# Patient Record
Sex: Male | Born: 2000 | Race: Black or African American | Hispanic: No | Marital: Single
Health system: Southern US, Community
[De-identification: ages and names within clinical notes are randomized; demographics above are authoritative.]

## PROBLEM LIST (undated history)

## (undated) ENCOUNTER — Emergency Department (HOSPITAL_COMMUNITY): Payer: No Typology Code available for payment source | Source: Home / Self Care

## (undated) DIAGNOSIS — F329 Major depressive disorder, single episode, unspecified: Secondary | ICD-10-CM

## (undated) DIAGNOSIS — F32A Depression, unspecified: Secondary | ICD-10-CM

---
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
## 2020-02-14 ENCOUNTER — Emergency Department (HOSPITAL_COMMUNITY): Payer: No Typology Code available for payment source

## 2020-02-14 ENCOUNTER — Other Ambulatory Visit: Payer: Self-pay

## 2020-02-14 ENCOUNTER — Emergency Department (HOSPITAL_COMMUNITY)
Admission: EM | Admit: 2020-02-14 | Discharge: 2020-02-14 | Disposition: A | Discharge status: Home / Self Care | Payer: No Typology Code available for payment source | Attending: Emergency Medicine | Admitting: Emergency Medicine

## 2020-02-14 ENCOUNTER — Encounter (HOSPITAL_COMMUNITY): Payer: Self-pay | Admitting: Emergency Medicine

## 2020-02-14 DIAGNOSIS — S27322A Contusion of lung, bilateral, initial encounter: Secondary | ICD-10-CM | POA: Diagnosis not present

## 2020-02-14 DIAGNOSIS — M542 Cervicalgia: Secondary | ICD-10-CM | POA: Insufficient documentation

## 2020-02-14 DIAGNOSIS — S22030A Wedge compression fracture of third thoracic vertebra, initial encounter for closed fracture: Secondary | ICD-10-CM | POA: Diagnosis not present

## 2020-02-14 DIAGNOSIS — R410 Disorientation, unspecified: Secondary | ICD-10-CM | POA: Insufficient documentation

## 2020-02-14 DIAGNOSIS — R202 Paresthesia of skin: Secondary | ICD-10-CM | POA: Diagnosis not present

## 2020-02-14 DIAGNOSIS — Y929 Unspecified place or not applicable: Secondary | ICD-10-CM | POA: Diagnosis not present

## 2020-02-14 DIAGNOSIS — T07XXXA Unspecified multiple injuries, initial encounter: Secondary | ICD-10-CM | POA: Diagnosis present

## 2020-02-14 DIAGNOSIS — R200 Anesthesia of skin: Secondary | ICD-10-CM | POA: Diagnosis not present

## 2020-02-14 DIAGNOSIS — M546 Pain in thoracic spine: Secondary | ICD-10-CM | POA: Insufficient documentation

## 2020-02-14 DIAGNOSIS — Y939 Activity, unspecified: Secondary | ICD-10-CM | POA: Insufficient documentation

## 2020-02-14 DIAGNOSIS — Y999 Unspecified external cause status: Secondary | ICD-10-CM | POA: Insufficient documentation

## 2020-02-14 DIAGNOSIS — Z20822 Contact with and (suspected) exposure to covid-19: Secondary | ICD-10-CM | POA: Insufficient documentation

## 2020-02-14 LAB — CBC
HCT: 42.7 % (ref 39.0–52.0)
Hemoglobin: 13.6 g/dL (ref 13.0–17.0)
MCH: 32.5 pg (ref 26.0–34.0)
MCHC: 31.9 g/dL (ref 30.0–36.0)
MCV: 102.2 fL — ABNORMAL HIGH (ref 80.0–100.0)
Platelets: 162 10*3/uL (ref 150–400)
RBC: 4.18 MIL/uL — ABNORMAL LOW (ref 4.22–5.81)
RDW: 13 % (ref 11.5–15.5)
WBC: 8.3 10*3/uL (ref 4.0–10.5)
nRBC: 0 % (ref 0.0–0.2)

## 2020-02-14 LAB — PROTIME-INR
INR: 1.1 (ref 0.8–1.2)
Prothrombin Time: 13.9 seconds (ref 11.4–15.2)

## 2020-02-14 LAB — I-STAT CHEM 8, ED
BUN: 14 mg/dL (ref 6–20)
Calcium, Ion: 0.66 mmol/L — CL (ref 1.15–1.40)
Chloride: 113 mmol/L — ABNORMAL HIGH (ref 98–111)
Creatinine, Ser: 0.9 mg/dL (ref 0.61–1.24)
Glucose, Bld: 93 mg/dL (ref 70–99)
HCT: 40 % (ref 39.0–52.0)
Hemoglobin: 13.6 g/dL (ref 13.0–17.0)
Potassium: 3.4 mmol/L — ABNORMAL LOW (ref 3.5–5.1)
Sodium: 134 mmol/L — ABNORMAL LOW (ref 135–145)
TCO2: 21 mmol/L — ABNORMAL LOW (ref 22–32)

## 2020-02-14 LAB — COMPREHENSIVE METABOLIC PANEL
ALT: 30 U/L (ref 0–44)
AST: 64 U/L — ABNORMAL HIGH (ref 15–41)
Albumin: 3.9 g/dL (ref 3.5–5.0)
Alkaline Phosphatase: 70 U/L (ref 38–126)
Anion gap: 11 (ref 5–15)
BUN: 13 mg/dL (ref 6–20)
CO2: 22 mmol/L (ref 22–32)
Calcium: 9.1 mg/dL (ref 8.9–10.3)
Chloride: 107 mmol/L (ref 98–111)
Creatinine, Ser: 0.86 mg/dL (ref 0.61–1.24)
GFR calc Af Amer: >60 mL/min (ref >60)
GFR calc non Af Amer: >60 mL/min (ref >60)
Glucose, Bld: 95 mg/dL (ref 70–99)
Potassium: 3.8 mmol/L (ref 3.5–5.1)
Sodium: 140 mmol/L (ref 135–145)
Total Bilirubin: 0.5 mg/dL (ref 0.3–1.2)
Total Protein: 7.1 g/dL (ref 6.5–8.1)

## 2020-02-14 LAB — RESPIRATORY PANEL BY RT PCR (FLU A&B, COVID)
Influenza A by PCR: NEGATIVE
Influenza B by PCR: NEGATIVE
SARS Coronavirus 2 by RT PCR: NEGATIVE

## 2020-02-14 LAB — SAMPLE TO BLOOD BANK

## 2020-02-14 LAB — LACTIC ACID, PLASMA: Lactic Acid, Venous: 2.6 mmol/L (ref 0.5–1.9)

## 2020-02-14 LAB — ETHANOL: Alcohol, Ethyl (B): 39 mg/dL — ABNORMAL HIGH (ref <10)

## 2020-02-14 MED ORDER — CYCLOBENZAPRINE HCL 10 MG PO TABS
10.0000 mg | ORAL_TABLET | Freq: Two times a day (BID) | ORAL | 0 refills | Status: AC | PRN
Start: 2020-02-14 — End: ?

## 2020-02-14 MED ORDER — MORPHINE SULFATE (PF) 4 MG/ML IV SOLN: 4.0000 mg | Freq: Once | INTRAVENOUS | Status: DC

## 2020-02-14 MED ORDER — HYDROCODONE-ACETAMINOPHEN 5-325 MG PO TABS: 1.0000 | ORAL_TABLET | ORAL | 0 refills | Status: AC | PRN

## 2020-02-14 MED ORDER — LORAZEPAM 2 MG/ML IJ SOLN
0.5000 mg | Freq: Once | INTRAMUSCULAR | Status: AC | PRN
  Administered 2020-02-14: 0.5 mg via INTRAVENOUS
  Filled 2020-02-14: qty 1

## 2020-02-14 MED ORDER — ONDANSETRON HCL 4 MG/2ML IJ SOLN
4.0000 mg | Freq: Once | INTRAMUSCULAR | Status: AC
  Administered 2020-02-14: 4 mg via INTRAVENOUS

## 2020-02-14 MED ORDER — LORAZEPAM 2 MG/ML IJ SOLN: 0.5000 mg | Freq: Once | INTRAMUSCULAR | Status: DC

## 2020-02-14 MED ORDER — MORPHINE SULFATE (PF) 4 MG/ML IV SOLN: 4.0000 mg | INTRAVENOUS | Status: DC | PRN

## 2020-02-14 MED ORDER — KETOROLAC TROMETHAMINE 15 MG/ML IJ SOLN
15.0000 mg | Freq: Once | INTRAMUSCULAR | Status: AC
  Administered 2020-02-14: 15 mg via INTRAVENOUS
  Filled 2020-02-14: qty 1

## 2020-02-14 MED ORDER — MORPHINE SULFATE (PF) 4 MG/ML IV SOLN
4.0000 mg | Freq: Once | INTRAVENOUS | Status: AC
  Administered 2020-02-14: 4 mg via INTRAVENOUS

## 2020-02-14 MED ORDER — IOHEXOL 300 MG/ML  SOLN
80.0000 mL | Freq: Once | INTRAMUSCULAR | Status: AC | PRN
  Administered 2020-02-14: 80 mL via INTRAVENOUS
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
## 2020-02-14 NOTE — Discharge Instructions (Signed)
You have compression fractures of your thoracic vertebrae, numbers 2, 3, and 4.  Wear the brace provided while you are up and walking.  Call to follow up with the Neurosurgeon.  Get rechecked if you have new weakness, can't pee or have new concerning symptoms.

## 2020-02-14 NOTE — ED Notes (Signed)
Pt is calling [employer] [phone]

## 2020-02-14 NOTE — ED Notes (Addendum)
Pt ambulated successfully. Pt dressed in paper scrubs. Pt is having juice and talking on the phone to family.

## 2020-02-14 NOTE — ED Notes (Signed)
Confirming Rx with provider before d/c

## 2020-02-14 NOTE — ED Notes (Signed)
Called ortho tech for brace.
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
## 2020-02-14 NOTE — ED Notes (Signed)
Echo at bedside

## 2020-02-14 NOTE — ED Notes (Signed)
Pt is being fitted for brace

## 2020-02-14 NOTE — ED Notes (Signed)
Patient verbalizes understanding of discharge instructions. Opportunity for questioning and answers were provided. Armband removed by staff, pt discharged from ED.  

## 2020-02-14 NOTE — ED Notes (Signed)
Pt actually d/c around 1630.
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
## 2020-02-14 NOTE — Discharge Planning (Signed)
EDCM to follow for disposition needs and finding next of kin.

## 2020-02-14 NOTE — ED Notes (Signed)
Pts [facility] [place] number is [phone]

## 2020-02-15 ENCOUNTER — Encounter (HOSPITAL_COMMUNITY): Payer: Self-pay | Admitting: Emergency Medicine
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
